# Patient Record
Sex: Female | Born: 2005 | Race: White | Hispanic: No | Marital: Single
Health system: Southern US, Community
[De-identification: ages and names within clinical notes are randomized; demographics above are authoritative.]

## PROBLEM LIST (undated history)

## (undated) DIAGNOSIS — F909 Attention-deficit hyperactivity disorder, unspecified type: Secondary | ICD-10-CM

## (undated) DIAGNOSIS — F32A Depression, unspecified: Secondary | ICD-10-CM

## (undated) DIAGNOSIS — F419 Anxiety disorder, unspecified: Secondary | ICD-10-CM

## (undated) DIAGNOSIS — F329 Major depressive disorder, single episode, unspecified: Secondary | ICD-10-CM

## (undated) DIAGNOSIS — R569 Unspecified convulsions: Secondary | ICD-10-CM

## (undated) HISTORY — PX: TONSILLECTOMY: SUR1361

---
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
## 2023-04-09 ENCOUNTER — Ambulatory Visit
Admission: EM | Admit: 2023-04-09 | Discharge: 2023-04-09 | Disposition: A | Discharge status: Home / Self Care | Payer: Medicaid Other | Attending: Internal Medicine | Admitting: Internal Medicine

## 2023-04-09 DIAGNOSIS — R350 Frequency of micturition: Secondary | ICD-10-CM | POA: Diagnosis not present

## 2023-04-09 DIAGNOSIS — N3000 Acute cystitis without hematuria: Secondary | ICD-10-CM | POA: Diagnosis not present

## 2023-04-09 DIAGNOSIS — R300 Dysuria: Secondary | ICD-10-CM | POA: Diagnosis not present

## 2023-04-09 LAB — POCT URINALYSIS DIP (MANUAL ENTRY)
Bilirubin, UA: NEGATIVE
Blood, UA: NEGATIVE
Glucose, UA: NEGATIVE mg/dL
Ketones, POC UA: NEGATIVE mg/dL
Nitrite, UA: NEGATIVE
Protein Ur, POC: NEGATIVE mg/dL
Spec Grav, UA: 1.01 (ref 1.010–1.025)
Urobilinogen, UA: 0.2 E.U./dL
pH, UA: 7 (ref 5.0–8.0)

## 2023-04-09 MED ORDER — SULFAMETHOXAZOLE-TRIMETHOPRIM 800-160 MG PO TABS: 1.0000 | ORAL_TABLET | Freq: Two times a day (BID) | ORAL | 0 refills | Status: AC

## 2023-04-09 NOTE — ED Provider Notes (Signed)
[facility]    CSN: [id number] Arrival date & time: 04/09/23  0913      History   Chief Complaint Chief Complaint  Patient presents with   Dysuria    HPI Ellen Sparks is a 17 y.o. female.   Patient presents with urinary burning and frequency that started about a week ago.  Denies vaginal discharge, abdominal pain, back pain, fever, chills, nausea, vomiting.  Reports that she is sexually active but denies exposure to STD.  Last menstrual cycle was around 04/05/2023.   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      Home Medications    Prior to Admission medications   Medication Sig Start Date End Date Taking? Authorizing Provider  sulfamethoxazole-trimethoprim (BACTRIM DS) 800-160 MG tablet Take 1 tablet by mouth 2 (two) times daily for 7 days. 04/09/23 04/16/23 Yes Tamy Accardo, Acie Fredrickson, FNP  DULoxetine (CYMBALTA) 60 MG capsule Take 60 mg by mouth daily.    [provider]  ibuprofen (ADVIL,MOTRIN) 200 MG tablet Take 400 mg by mouth every 6 (six) hours as needed (for headaches or pain).     [provider]  Ibuprofen-diphenhydrAMINE Cit (ADVIL PM PO) Take 1-2 tablets by mouth at bedtime as  needed (for sleep).     [provider]  lisdexamfetamine (VYVANSE) 30 MG capsule Take 30 mg by mouth daily.    [provider]  Melatonin 5 MG TABS Take 5 mg at bedtime as needed by mouth (sleep).     [provider]  omeprazole (PRILOSEC OTC) 20 MG tablet Take 20 mg by mouth at bedtime as needed (acid reflux).    [provider]  OXcarbazepine (TRILEPTAL) 150 MG tablet Take 1 tablet (150 mg total) by mouth 2 (two) times daily. 02/04/21 03/06/21  Estella Husk, MD  polyethylene glycol [facility]) packet Take 17 g by mouth daily. Patient taking differently: Take 17 g by mouth daily as needed for mild constipation.  12/02/17   Sherrilee Gilles, NP    Family History Family History  Problem Relation Age of Onset   Migraines Mother    ADD / ADHD Mother    Anxiety disorder Mother    Depression Mother    Hypertension Maternal Grandfather    ADD / ADHD Father    Bipolar disorder Father    Anxiety disorder Father    Depression Father    Anxiety disorder Paternal Grandmother    Depression Paternal Grandmother    Seizures Neg Hx    Autism Neg Hx    Schizophrenia Neg Hx     Social History Social History   Tobacco Use   Smoking status: Passive Smoke  Exposure - Never Smoker   Smokeless tobacco: Never  Substance Use Topics   Alcohol use: No   Drug use: No     Allergies   Red dye   Review of Systems Review of Systems Per HPI  Physical Exam Triage Vital Signs ED Triage Vitals  Encounter Vitals Group     BP 04/09/23 1012 116/72     Systolic BP Percentile --      Diastolic BP Percentile --      Pulse Rate 04/09/23 1012 60     Resp 04/09/23 1012 16     Temp 04/09/23 1012 98.3 F (36.8 C)     Temp Source 04/09/23 1012 Oral     SpO2 04/09/23 1012 98 %     Weight --      Height --      Head Circumference --      Peak Flow --      Pain Score 04/09/23 1014 0     Pain Loc --      Pain Education --      Exclude from Growth Chart --     No data found.  Updated Vital Signs BP 116/72 (BP Location: Left Arm)   Pulse 60   Temp 98.3 F (36.8 C) (Oral)   Resp 16   LMP 04/05/2023 (Approximate)   SpO2 98%   Visual Acuity Right Eye Distance:   Left Eye Distance:   Bilateral Distance:    Right Eye Near:   Left Eye Near:    Bilateral Near:     Physical Exam Constitutional:      General: She is not in acute distress.    Appearance: Normal appearance. She is not toxic-appearing or diaphoretic.  HENT:     Head: Normocephalic and atraumatic.  Eyes:     Extraocular Movements: Extraocular movements intact.     Conjunctiva/sclera: Conjunctivae normal.  Pulmonary:     Effort: Pulmonary effort is normal.  Genitourinary:    Comments: Deferred with shared decision making. Self swab performed.  Neurological:     General: No focal deficit present.     Mental Status: She is alert and oriented to person, place, and time. Mental status is at baseline.  Psychiatric:        Mood and Affect: Mood normal.        Behavior: Behavior normal.        Thought Content: Thought content normal.        Judgment: Judgment normal.      UC Treatments / Results  Labs (all labs ordered are listed, but only abnormal results are displayed) Labs Reviewed  POCT URINALYSIS DIP (MANUAL ENTRY) - Abnormal; Notable for the following components:      Result Value   Leukocytes, UA Trace (*)    All other components within normal limits  URINE CULTURE  CERVICOVAGINAL ANCILLARY ONLY    EKG   Radiology No results found.  Procedures Procedures (including critical care time)  Medications Ordered in UC Medications - No data to display  Initial Impression / Assessment and Plan / UC Course  I have reviewed the triage vital signs and the nursing notes.  Pertinent labs & imaging results that were available during my care of the patient were reviewed by me and considered in my medical decision making (see chart for details).     UA only  showed trace amount of leukocytes but with patient's associated symptoms and no vaginal discharge, will opt to treat for UTI  with antibiotic.  Bactrim sent for patient given she has a red dye allergy.  If urine culture comes back resistant to Bactrim, we need to be sure with pharmacy that medication does not contain red dye.  Will send cervicovaginal swab to test for BV and yeast to ensure the vaginitis is not contributing to patient's symptoms.  Patient denies testing for STD.  Advised strict follow-up precautions.  Patient verbalized understanding and was agreeable with plan. Final Clinical Impressions(s) / UC Diagnoses   Final diagnoses:  Acute cystitis without hematuria  Dysuria  Urinary frequency     Discharge Instructions      I have prescribed you an antibiotic for UTI.  Urine culture and vaginal swab are pending.  Will call with the result.  Ensure you are drinking plenty of water.    ED Prescriptions     Medication Sig Dispense Auth. Provider   sulfamethoxazole-trimethoprim (BACTRIM DS) 800-160 MG tablet Take 1 tablet by mouth 2 (two) times daily for 7 days. 14 tablet [place], Acie Fredrickson, [place]      PDMP not reviewed this encounter.   Gustavus Bryant, [place] 04/09/23 [phone]

## 2023-04-09 NOTE — ED Triage Notes (Signed)
Pt states burning with urination and frequency for the past week.  States she has been taking AZO at home.

## 2023-04-09 NOTE — Discharge Instructions (Signed)
I have prescribed you an antibiotic for UTI.  Urine culture and vaginal swab are pending.  Will call with the result.  Ensure you are drinking plenty of water.

## 2023-04-10 LAB — URINE CULTURE: Culture: NO GROWTH

## 2023-04-11 LAB — CERVICOVAGINAL ANCILLARY ONLY
Bacterial Vaginitis (gardnerella): NEGATIVE
Candida Glabrata: NEGATIVE
Candida Vaginitis: NEGATIVE
Comment: NEGATIVE
Comment: NEGATIVE
Comment: NEGATIVE

## 2023-05-25 DIAGNOSIS — F4312 Post-traumatic stress disorder, chronic: Secondary | ICD-10-CM | POA: Diagnosis not present

## 2023-05-25 DIAGNOSIS — F902 Attention-deficit hyperactivity disorder, combined type: Secondary | ICD-10-CM | POA: Diagnosis not present

## 2023-06-01 DIAGNOSIS — F4312 Post-traumatic stress disorder, chronic: Secondary | ICD-10-CM | POA: Diagnosis not present

## 2023-06-01 DIAGNOSIS — F902 Attention-deficit hyperactivity disorder, combined type: Secondary | ICD-10-CM | POA: Diagnosis not present

## 2023-06-08 DIAGNOSIS — F902 Attention-deficit hyperactivity disorder, combined type: Secondary | ICD-10-CM | POA: Diagnosis not present

## 2023-06-08 DIAGNOSIS — F4312 Post-traumatic stress disorder, chronic: Secondary | ICD-10-CM | POA: Diagnosis not present

## 2023-06-22 DIAGNOSIS — F4312 Post-traumatic stress disorder, chronic: Secondary | ICD-10-CM | POA: Diagnosis not present

## 2023-06-22 DIAGNOSIS — F902 Attention-deficit hyperactivity disorder, combined type: Secondary | ICD-10-CM | POA: Diagnosis not present

## 2023-06-29 DIAGNOSIS — F4312 Post-traumatic stress disorder, chronic: Secondary | ICD-10-CM | POA: Diagnosis not present

## 2023-06-29 DIAGNOSIS — F902 Attention-deficit hyperactivity disorder, combined type: Secondary | ICD-10-CM | POA: Diagnosis not present

## 2023-07-06 DIAGNOSIS — F4312 Post-traumatic stress disorder, chronic: Secondary | ICD-10-CM | POA: Diagnosis not present

## 2023-07-06 DIAGNOSIS — F902 Attention-deficit hyperactivity disorder, combined type: Secondary | ICD-10-CM | POA: Diagnosis not present

## 2023-07-13 DIAGNOSIS — F4312 Post-traumatic stress disorder, chronic: Secondary | ICD-10-CM | POA: Diagnosis not present

## 2023-07-13 DIAGNOSIS — F902 Attention-deficit hyperactivity disorder, combined type: Secondary | ICD-10-CM | POA: Diagnosis not present

## 2023-07-26 ENCOUNTER — Ambulatory Visit
Admission: EM | Admit: 2023-07-26 | Discharge: 2023-07-26 | Disposition: A | Discharge status: Home / Self Care | Payer: 59 | Attending: Internal Medicine | Admitting: Internal Medicine

## 2023-07-26 DIAGNOSIS — Z3202 Encounter for pregnancy test, result negative: Secondary | ICD-10-CM | POA: Diagnosis not present

## 2023-07-26 DIAGNOSIS — R1030 Lower abdominal pain, unspecified: Secondary | ICD-10-CM | POA: Diagnosis present

## 2023-07-26 DIAGNOSIS — Z113 Encounter for screening for infections with a predominantly sexual mode of transmission: Secondary | ICD-10-CM | POA: Diagnosis present

## 2023-07-26 LAB — POCT URINALYSIS DIP (MANUAL ENTRY)
Bilirubin, UA: NEGATIVE
Blood, UA: NEGATIVE
Glucose, UA: NEGATIVE mg/dL
Ketones, POC UA: NEGATIVE mg/dL
Leukocytes, UA: NEGATIVE
Nitrite, UA: NEGATIVE
Protein Ur, POC: NEGATIVE mg/dL
Spec Grav, UA: 1.01 (ref 1.010–1.025)
Urobilinogen, UA: 0.2 U/dL
pH, UA: 7 (ref 5.0–8.0)

## 2023-07-26 LAB — POCT URINE PREGNANCY: Preg Test, Ur: NEGATIVE

## 2023-07-26 NOTE — ED Provider Notes (Signed)
[facility]    CSN: [id number] Arrival date & time: 07/26/23  0903      History   Chief Complaint Chief Complaint  Patient presents with   Abdominal Pain    HPI Ellen Sparks is a 17 y.o. female.   Patient presents with lower abdominal pain that started this morning and has been intermittent.  Reports that it worsens when she tries to urinate or have a bowel movement.  Although, she denies dysuria, urinary frequency, hematuria.  Reports that she has had an intermittent white-colored vaginal discharge for a few weeks.  She has had unprotected sexual intercourse but denies exposure to STD.  Reports minimal nausea without vomiting.  She had a bowel movement this morning and has been having normal bowel movements with no blood in stool.  Denies fever, body aches, chills.  Last menstrual cycle was 06/19/2023.   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      Home Medications    Prior to Admission medications   Medication Sig Start Date End Date Taking? Authorizing Provider  DULoxetine (CYMBALTA) 60 MG capsule Take 60 mg by mouth daily.    [provider]  ibuprofen  (ADVIL,MOTRIN) 200 MG tablet Take 400 mg by mouth every 6 (six) hours as needed (for headaches or pain).     [provider]  Ibuprofen-diphenhydrAMINE Cit (ADVIL PM PO) Take 1-2 tablets by mouth at bedtime as needed (for sleep).     [provider]  lisdexamfetamine (VYVANSE) 30 MG capsule Take 30 mg by mouth daily.    [provider]  Melatonin 5 MG TABS Take 5 mg at bedtime as needed by mouth (sleep).     [provider]  omeprazole (PRILOSEC OTC) 20 MG tablet Take 20 mg by mouth at bedtime as needed (acid reflux).    [provider]  OXcarbazepine (TRILEPTAL) 150 MG tablet Take 1 tablet (150 mg total) by mouth 2 (two) times daily. 02/04/21 03/06/21  Estella Husk, MD  polyethylene glycol [facility]) packet Take 17 g by mouth daily. Patient taking differently: Take 17 g by mouth daily as needed for mild constipation.  12/02/17   Sherrilee Gilles, NP    Family History Family History  Problem Relation Age of Onset   Migraines Mother    ADD / ADHD Mother    Anxiety disorder Mother    Depression Mother    Hypertension Maternal Grandfather    ADD / ADHD Father    Bipolar disorder Father    Anxiety disorder Father    Depression Father    Anxiety disorder Paternal Grandmother    Depression Paternal Grandmother  Seizures Neg Hx    Autism Neg Hx    Schizophrenia Neg Hx     Social History Social History   Tobacco Use   Smoking status: Passive Smoke Exposure - Never Smoker   Smokeless tobacco: Never  Substance Use Topics   Alcohol use: No   Drug use: No     Allergies   Red dye #40 (allura red)   Review of Systems Review of Systems Per HPI  Physical Exam Triage Vital Signs ED Triage Vitals [07/26/23 0959]  Encounter Vitals Group     BP 105/70     Systolic BP Percentile      Diastolic BP Percentile      Pulse Rate 66     Resp 20     Temp 98.1 F (36.7 C)     Temp Source Oral     SpO2 98 %     Weight 172 lb (78  kg)     Height 5\' 2"  (1.575 m)     Head Circumference      Peak Flow      Pain Score 6     Pain Loc      Pain Education      Exclude from Growth Chart    No data found.  Updated Vital Signs BP 105/70 (BP Location: Left Arm)   Pulse 66   Temp 98.1 F (36.7 C) (Oral)   Resp 20   Ht 5\' 2"  (1.575 m)   Wt 172 lb (78 kg)   LMP 06/28/2023   SpO2 98%   BMI 31.46 kg/m   Visual Acuity Right Eye Distance:   Left Eye Distance:   Bilateral Distance:    Right Eye Near:   Left Eye Near:    Bilateral Near:     Physical Exam Constitutional:      General: She is not in acute distress.    Appearance: Normal appearance. She is not toxic-appearing or diaphoretic.     Comments: Patient is sitting upright in chair in no acute distress  HENT:     Head: Normocephalic and atraumatic.  Eyes:     Extraocular Movements: Extraocular movements intact.     Conjunctiva/sclera: Conjunctivae normal.  Cardiovascular:     Rate and Rhythm: Normal rate and regular rhythm.     Pulses: Normal pulses.     Heart sounds: Normal heart sounds.  Pulmonary:     Effort: Pulmonary effort is normal. No respiratory distress.     Breath sounds: Normal breath sounds.  Abdominal:     General: Bowel sounds are normal. There is no distension.     Palpations: Abdomen is soft.     Tenderness: There is abdominal tenderness.     Comments: Very minimal tenderness to palpation to mid lower abdomen.  Neurological:     General: No focal deficit present.     Mental Status: She is alert and oriented to person, place, and time. Mental status is at baseline.  Psychiatric:        Mood and Affect: Mood normal.        Behavior: Behavior normal.        Thought Content: Thought content normal.        Judgment: Judgment normal.      UC Treatments / Results  Labs (all labs ordered are listed, but only abnormal results are displayed) Labs Reviewed  POCT URINALYSIS DIP (MANUAL ENTRY) - Abnormal; Notable for the following  components:      Result Value  Clarity, UA hazy (*)    All other components within normal limits  POCT URINE PREGNANCY  CERVICOVAGINAL ANCILLARY ONLY    EKG   Radiology No results found.  Procedures Procedures (including critical care time)  Medications Ordered in UC Medications - No data to display  Initial Impression / Assessment and Plan / UC Course  I have reviewed the triage vital signs and the nursing notes.  Pertinent labs & imaging results that were available during my care of the patient were reviewed by me and considered in my medical decision making (see chart for details).     Differential diagnoses for lower abdominal pain include urinary tract infection versus vaginitis versus fibroids versus gastroenteritis.  There are no signs of acute abdomen or dehydration on exam so do not think that imaging or emergent evaluation is necessary.  Urine pregnancy test was negative.  UA unremarkable.  Cervicovaginal swab pending.  Will await results for any further treatment at this time.  Advised patient to go to the emergency department if symptoms persist or worsen.  Patient verbalized understanding and was agreeable with plan. Final Clinical Impressions(s) / UC Diagnoses   Final diagnoses:  Lower abdominal pain  Urine pregnancy test negative  Screening examination for venereal disease     Discharge Instructions      Urine was clear.  Vaginal swab is pending.  Please follow-up if symptoms persist or worsen.     ED Prescriptions   None    PDMP not reviewed this encounter.   Gustavus Bryant, [place] 07/26/23 1112

## 2023-07-26 NOTE — Discharge Instructions (Signed)
Urine was clear.  Vaginal swab is pending.  Please follow-up if symptoms persist or worsen.

## 2023-07-26 NOTE — ED Triage Notes (Signed)
Patient presents with abdominal pain that started this morning. No treatment used.

## 2023-07-27 DIAGNOSIS — F902 Attention-deficit hyperactivity disorder, combined type: Secondary | ICD-10-CM | POA: Diagnosis not present

## 2023-07-27 DIAGNOSIS — F4312 Post-traumatic stress disorder, chronic: Secondary | ICD-10-CM | POA: Diagnosis not present

## 2023-07-27 LAB — CERVICOVAGINAL ANCILLARY ONLY
Bacterial Vaginitis (gardnerella): NEGATIVE
Candida Glabrata: NEGATIVE
Candida Vaginitis: NEGATIVE
Chlamydia: NEGATIVE
Comment: NEGATIVE
Comment: NEGATIVE
Comment: NEGATIVE
Comment: NEGATIVE
Comment: NEGATIVE
Comment: NORMAL
Neisseria Gonorrhea: NEGATIVE
Trichomonas: NEGATIVE

## 2023-08-03 DIAGNOSIS — F4312 Post-traumatic stress disorder, chronic: Secondary | ICD-10-CM | POA: Diagnosis not present

## 2023-08-03 DIAGNOSIS — F902 Attention-deficit hyperactivity disorder, combined type: Secondary | ICD-10-CM | POA: Diagnosis not present

## 2023-08-10 DIAGNOSIS — F902 Attention-deficit hyperactivity disorder, combined type: Secondary | ICD-10-CM | POA: Diagnosis not present

## 2023-08-10 DIAGNOSIS — F4312 Post-traumatic stress disorder, chronic: Secondary | ICD-10-CM | POA: Diagnosis not present

## 2023-08-17 DIAGNOSIS — F902 Attention-deficit hyperactivity disorder, combined type: Secondary | ICD-10-CM | POA: Diagnosis not present

## 2023-08-17 DIAGNOSIS — F4312 Post-traumatic stress disorder, chronic: Secondary | ICD-10-CM | POA: Diagnosis not present

## 2023-08-24 DIAGNOSIS — F902 Attention-deficit hyperactivity disorder, combined type: Secondary | ICD-10-CM | POA: Diagnosis not present

## 2023-08-24 DIAGNOSIS — F4312 Post-traumatic stress disorder, chronic: Secondary | ICD-10-CM | POA: Diagnosis not present

## 2023-08-31 DIAGNOSIS — F4312 Post-traumatic stress disorder, chronic: Secondary | ICD-10-CM | POA: Diagnosis not present

## 2023-08-31 DIAGNOSIS — F902 Attention-deficit hyperactivity disorder, combined type: Secondary | ICD-10-CM | POA: Diagnosis not present

## 2023-09-21 DIAGNOSIS — F4312 Post-traumatic stress disorder, chronic: Secondary | ICD-10-CM | POA: Diagnosis not present

## 2023-09-21 DIAGNOSIS — F902 Attention-deficit hyperactivity disorder, combined type: Secondary | ICD-10-CM | POA: Diagnosis not present

## 2023-09-28 DIAGNOSIS — F4312 Post-traumatic stress disorder, chronic: Secondary | ICD-10-CM | POA: Diagnosis not present

## 2023-09-28 DIAGNOSIS — F902 Attention-deficit hyperactivity disorder, combined type: Secondary | ICD-10-CM | POA: Diagnosis not present

## 2023-10-05 DIAGNOSIS — F902 Attention-deficit hyperactivity disorder, combined type: Secondary | ICD-10-CM | POA: Diagnosis not present

## 2023-10-05 DIAGNOSIS — F4312 Post-traumatic stress disorder, chronic: Secondary | ICD-10-CM | POA: Diagnosis not present

## 2023-10-12 DIAGNOSIS — F4312 Post-traumatic stress disorder, chronic: Secondary | ICD-10-CM | POA: Diagnosis not present

## 2023-10-12 DIAGNOSIS — F902 Attention-deficit hyperactivity disorder, combined type: Secondary | ICD-10-CM | POA: Diagnosis not present

## 2023-10-19 DIAGNOSIS — F4312 Post-traumatic stress disorder, chronic: Secondary | ICD-10-CM | POA: Diagnosis not present

## 2023-10-19 DIAGNOSIS — F902 Attention-deficit hyperactivity disorder, combined type: Secondary | ICD-10-CM | POA: Diagnosis not present

## 2023-10-26 DIAGNOSIS — F4312 Post-traumatic stress disorder, chronic: Secondary | ICD-10-CM | POA: Diagnosis not present

## 2023-10-26 DIAGNOSIS — F902 Attention-deficit hyperactivity disorder, combined type: Secondary | ICD-10-CM | POA: Diagnosis not present

## 2023-11-09 DIAGNOSIS — F902 Attention-deficit hyperactivity disorder, combined type: Secondary | ICD-10-CM | POA: Diagnosis not present

## 2023-11-09 DIAGNOSIS — F4312 Post-traumatic stress disorder, chronic: Secondary | ICD-10-CM | POA: Diagnosis not present

## 2023-11-16 DIAGNOSIS — F4312 Post-traumatic stress disorder, chronic: Secondary | ICD-10-CM | POA: Diagnosis not present

## 2023-11-16 DIAGNOSIS — F902 Attention-deficit hyperactivity disorder, combined type: Secondary | ICD-10-CM | POA: Diagnosis not present

## 2023-11-23 DIAGNOSIS — F4312 Post-traumatic stress disorder, chronic: Secondary | ICD-10-CM | POA: Diagnosis not present

## 2023-11-23 DIAGNOSIS — F902 Attention-deficit hyperactivity disorder, combined type: Secondary | ICD-10-CM | POA: Diagnosis not present

## 2023-11-30 DIAGNOSIS — F4312 Post-traumatic stress disorder, chronic: Secondary | ICD-10-CM | POA: Diagnosis not present

## 2023-11-30 DIAGNOSIS — F902 Attention-deficit hyperactivity disorder, combined type: Secondary | ICD-10-CM | POA: Diagnosis not present

## 2023-12-07 DIAGNOSIS — F4312 Post-traumatic stress disorder, chronic: Secondary | ICD-10-CM | POA: Diagnosis not present

## 2023-12-07 DIAGNOSIS — F902 Attention-deficit hyperactivity disorder, combined type: Secondary | ICD-10-CM | POA: Diagnosis not present

## 2023-12-14 DIAGNOSIS — F4312 Post-traumatic stress disorder, chronic: Secondary | ICD-10-CM | POA: Diagnosis not present

## 2023-12-14 DIAGNOSIS — F902 Attention-deficit hyperactivity disorder, combined type: Secondary | ICD-10-CM | POA: Diagnosis not present

## 2023-12-21 DIAGNOSIS — F4312 Post-traumatic stress disorder, chronic: Secondary | ICD-10-CM | POA: Diagnosis not present

## 2023-12-21 DIAGNOSIS — F902 Attention-deficit hyperactivity disorder, combined type: Secondary | ICD-10-CM | POA: Diagnosis not present

## 2023-12-28 DIAGNOSIS — F4312 Post-traumatic stress disorder, chronic: Secondary | ICD-10-CM | POA: Diagnosis not present

## 2023-12-28 DIAGNOSIS — F902 Attention-deficit hyperactivity disorder, combined type: Secondary | ICD-10-CM | POA: Diagnosis not present

## 2024-01-04 DIAGNOSIS — F902 Attention-deficit hyperactivity disorder, combined type: Secondary | ICD-10-CM | POA: Diagnosis not present

## 2024-01-04 DIAGNOSIS — F4312 Post-traumatic stress disorder, chronic: Secondary | ICD-10-CM | POA: Diagnosis not present

## 2024-01-11 DIAGNOSIS — F902 Attention-deficit hyperactivity disorder, combined type: Secondary | ICD-10-CM | POA: Diagnosis not present

## 2024-01-11 DIAGNOSIS — F4312 Post-traumatic stress disorder, chronic: Secondary | ICD-10-CM | POA: Diagnosis not present

## 2024-01-18 DIAGNOSIS — F4312 Post-traumatic stress disorder, chronic: Secondary | ICD-10-CM | POA: Diagnosis not present

## 2024-01-18 DIAGNOSIS — F902 Attention-deficit hyperactivity disorder, combined type: Secondary | ICD-10-CM | POA: Diagnosis not present

## 2024-01-25 DIAGNOSIS — F4312 Post-traumatic stress disorder, chronic: Secondary | ICD-10-CM | POA: Diagnosis not present

## 2024-01-25 DIAGNOSIS — F902 Attention-deficit hyperactivity disorder, combined type: Secondary | ICD-10-CM | POA: Diagnosis not present

## 2024-02-01 DIAGNOSIS — F902 Attention-deficit hyperactivity disorder, combined type: Secondary | ICD-10-CM | POA: Diagnosis not present

## 2024-02-01 DIAGNOSIS — F4312 Post-traumatic stress disorder, chronic: Secondary | ICD-10-CM | POA: Diagnosis not present

## 2024-02-08 DIAGNOSIS — F4312 Post-traumatic stress disorder, chronic: Secondary | ICD-10-CM | POA: Diagnosis not present

## 2024-02-08 DIAGNOSIS — F902 Attention-deficit hyperactivity disorder, combined type: Secondary | ICD-10-CM | POA: Diagnosis not present

## 2024-02-15 DIAGNOSIS — F4312 Post-traumatic stress disorder, chronic: Secondary | ICD-10-CM | POA: Diagnosis not present

## 2024-02-15 DIAGNOSIS — F902 Attention-deficit hyperactivity disorder, combined type: Secondary | ICD-10-CM | POA: Diagnosis not present

## 2024-02-22 DIAGNOSIS — F4312 Post-traumatic stress disorder, chronic: Secondary | ICD-10-CM | POA: Diagnosis not present

## 2024-02-22 DIAGNOSIS — F902 Attention-deficit hyperactivity disorder, combined type: Secondary | ICD-10-CM | POA: Diagnosis not present

## 2024-02-29 DIAGNOSIS — F4312 Post-traumatic stress disorder, chronic: Secondary | ICD-10-CM | POA: Diagnosis not present

## 2024-02-29 DIAGNOSIS — F902 Attention-deficit hyperactivity disorder, combined type: Secondary | ICD-10-CM | POA: Diagnosis not present

## 2024-03-14 DIAGNOSIS — F4312 Post-traumatic stress disorder, chronic: Secondary | ICD-10-CM | POA: Diagnosis not present

## 2024-03-14 DIAGNOSIS — F902 Attention-deficit hyperactivity disorder, combined type: Secondary | ICD-10-CM | POA: Diagnosis not present

## 2024-03-21 DIAGNOSIS — F4312 Post-traumatic stress disorder, chronic: Secondary | ICD-10-CM | POA: Diagnosis not present

## 2024-03-21 DIAGNOSIS — F902 Attention-deficit hyperactivity disorder, combined type: Secondary | ICD-10-CM | POA: Diagnosis not present

## 2024-03-28 DIAGNOSIS — F902 Attention-deficit hyperactivity disorder, combined type: Secondary | ICD-10-CM | POA: Diagnosis not present

## 2024-03-28 DIAGNOSIS — F4312 Post-traumatic stress disorder, chronic: Secondary | ICD-10-CM | POA: Diagnosis not present

## 2024-04-04 DIAGNOSIS — F902 Attention-deficit hyperactivity disorder, combined type: Secondary | ICD-10-CM | POA: Diagnosis not present

## 2024-04-04 DIAGNOSIS — F4312 Post-traumatic stress disorder, chronic: Secondary | ICD-10-CM | POA: Diagnosis not present

## 2024-04-11 DIAGNOSIS — F4312 Post-traumatic stress disorder, chronic: Secondary | ICD-10-CM | POA: Diagnosis not present

## 2024-04-11 DIAGNOSIS — F902 Attention-deficit hyperactivity disorder, combined type: Secondary | ICD-10-CM | POA: Diagnosis not present

## 2024-04-18 DIAGNOSIS — F902 Attention-deficit hyperactivity disorder, combined type: Secondary | ICD-10-CM | POA: Diagnosis not present

## 2024-04-18 DIAGNOSIS — F4312 Post-traumatic stress disorder, chronic: Secondary | ICD-10-CM | POA: Diagnosis not present

## 2024-04-20 DIAGNOSIS — Z79899 Other long term (current) drug therapy: Secondary | ICD-10-CM | POA: Diagnosis not present

## 2024-04-20 DIAGNOSIS — Z5181 Encounter for therapeutic drug level monitoring: Secondary | ICD-10-CM | POA: Diagnosis not present

## 2024-04-25 DIAGNOSIS — F4312 Post-traumatic stress disorder, chronic: Secondary | ICD-10-CM | POA: Diagnosis not present

## 2024-04-25 DIAGNOSIS — F902 Attention-deficit hyperactivity disorder, combined type: Secondary | ICD-10-CM | POA: Diagnosis not present

## 2024-05-02 DIAGNOSIS — F902 Attention-deficit hyperactivity disorder, combined type: Secondary | ICD-10-CM | POA: Diagnosis not present

## 2024-05-02 DIAGNOSIS — F4312 Post-traumatic stress disorder, chronic: Secondary | ICD-10-CM | POA: Diagnosis not present

## 2024-05-09 DIAGNOSIS — F902 Attention-deficit hyperactivity disorder, combined type: Secondary | ICD-10-CM | POA: Diagnosis not present

## 2024-05-09 DIAGNOSIS — F4312 Post-traumatic stress disorder, chronic: Secondary | ICD-10-CM | POA: Diagnosis not present

## 2024-05-16 DIAGNOSIS — F902 Attention-deficit hyperactivity disorder, combined type: Secondary | ICD-10-CM | POA: Diagnosis not present

## 2024-05-16 DIAGNOSIS — F4312 Post-traumatic stress disorder, chronic: Secondary | ICD-10-CM | POA: Diagnosis not present

## 2024-05-18 DIAGNOSIS — F319 Bipolar disorder, unspecified: Secondary | ICD-10-CM | POA: Diagnosis not present

## 2024-05-18 DIAGNOSIS — F411 Generalized anxiety disorder: Secondary | ICD-10-CM | POA: Diagnosis not present

## 2024-05-30 DIAGNOSIS — F4312 Post-traumatic stress disorder, chronic: Secondary | ICD-10-CM | POA: Diagnosis not present

## 2024-05-30 DIAGNOSIS — F902 Attention-deficit hyperactivity disorder, combined type: Secondary | ICD-10-CM | POA: Diagnosis not present

## 2024-06-06 DIAGNOSIS — F4312 Post-traumatic stress disorder, chronic: Secondary | ICD-10-CM | POA: Diagnosis not present

## 2024-06-06 DIAGNOSIS — F902 Attention-deficit hyperactivity disorder, combined type: Secondary | ICD-10-CM | POA: Diagnosis not present

## 2024-06-13 DIAGNOSIS — F902 Attention-deficit hyperactivity disorder, combined type: Secondary | ICD-10-CM | POA: Diagnosis not present

## 2024-06-13 DIAGNOSIS — F4312 Post-traumatic stress disorder, chronic: Secondary | ICD-10-CM | POA: Diagnosis not present

## 2024-06-20 DIAGNOSIS — F4312 Post-traumatic stress disorder, chronic: Secondary | ICD-10-CM | POA: Diagnosis not present

## 2024-06-20 DIAGNOSIS — F902 Attention-deficit hyperactivity disorder, combined type: Secondary | ICD-10-CM | POA: Diagnosis not present

## 2024-06-27 DIAGNOSIS — F4312 Post-traumatic stress disorder, chronic: Secondary | ICD-10-CM | POA: Diagnosis not present

## 2024-06-27 DIAGNOSIS — F902 Attention-deficit hyperactivity disorder, combined type: Secondary | ICD-10-CM | POA: Diagnosis not present

## 2024-06-29 DIAGNOSIS — F411 Generalized anxiety disorder: Secondary | ICD-10-CM | POA: Diagnosis not present

## 2024-06-29 DIAGNOSIS — F3181 Bipolar II disorder: Secondary | ICD-10-CM | POA: Diagnosis not present

## 2024-07-04 DIAGNOSIS — F4312 Post-traumatic stress disorder, chronic: Secondary | ICD-10-CM | POA: Diagnosis not present

## 2024-07-04 DIAGNOSIS — F902 Attention-deficit hyperactivity disorder, combined type: Secondary | ICD-10-CM | POA: Diagnosis not present

## 2024-07-11 DIAGNOSIS — F902 Attention-deficit hyperactivity disorder, combined type: Secondary | ICD-10-CM | POA: Diagnosis not present

## 2024-07-11 DIAGNOSIS — F4312 Post-traumatic stress disorder, chronic: Secondary | ICD-10-CM | POA: Diagnosis not present

## 2024-07-18 DIAGNOSIS — F902 Attention-deficit hyperactivity disorder, combined type: Secondary | ICD-10-CM | POA: Diagnosis not present

## 2024-07-18 DIAGNOSIS — F4312 Post-traumatic stress disorder, chronic: Secondary | ICD-10-CM | POA: Diagnosis not present

## 2024-07-25 DIAGNOSIS — F902 Attention-deficit hyperactivity disorder, combined type: Secondary | ICD-10-CM | POA: Diagnosis not present

## 2024-07-25 DIAGNOSIS — F4312 Post-traumatic stress disorder, chronic: Secondary | ICD-10-CM | POA: Diagnosis not present

## 2024-08-01 DIAGNOSIS — F902 Attention-deficit hyperactivity disorder, combined type: Secondary | ICD-10-CM | POA: Diagnosis not present

## 2024-08-01 DIAGNOSIS — F4312 Post-traumatic stress disorder, chronic: Secondary | ICD-10-CM | POA: Diagnosis not present

## 2024-08-15 DIAGNOSIS — F902 Attention-deficit hyperactivity disorder, combined type: Secondary | ICD-10-CM | POA: Diagnosis not present

## 2024-08-15 DIAGNOSIS — F4312 Post-traumatic stress disorder, chronic: Secondary | ICD-10-CM | POA: Diagnosis not present

## 2024-08-22 DIAGNOSIS — F902 Attention-deficit hyperactivity disorder, combined type: Secondary | ICD-10-CM | POA: Diagnosis not present

## 2024-08-22 DIAGNOSIS — F4312 Post-traumatic stress disorder, chronic: Secondary | ICD-10-CM | POA: Diagnosis not present

## 2024-08-24 DIAGNOSIS — F411 Generalized anxiety disorder: Secondary | ICD-10-CM | POA: Diagnosis not present

## 2024-08-24 DIAGNOSIS — F3181 Bipolar II disorder: Secondary | ICD-10-CM | POA: Diagnosis not present

## 2024-08-29 DIAGNOSIS — F902 Attention-deficit hyperactivity disorder, combined type: Secondary | ICD-10-CM | POA: Diagnosis not present

## 2024-08-29 DIAGNOSIS — F4312 Post-traumatic stress disorder, chronic: Secondary | ICD-10-CM | POA: Diagnosis not present
